# Patient Record
Sex: Female | Born: 1960 | Race: White | Hispanic: No | Marital: Married | Smoking: Never smoker
Health system: Southern US, Community
[De-identification: ages and names within clinical notes are randomized; demographics above are authoritative.]

## PROBLEM LIST (undated history)

## (undated) HISTORY — PX: KNEE ARTHROSCOPY WITH EXCISION BAKER'S CYST: SHX5646

---

## 1997-12-18 ENCOUNTER — Encounter: Payer: Self-pay | Admitting: *Deleted

## 1997-12-18 ENCOUNTER — Ambulatory Visit (HOSPITAL_COMMUNITY): Admission: RE | Admit: 1997-12-18 | Discharge: 1997-12-18 | Payer: Self-pay | Admitting: *Deleted

## 1998-03-03 ENCOUNTER — Encounter: Admission: RE | Admit: 1998-03-03 | Discharge: 1998-03-03 | Payer: Self-pay | Admitting: Family Medicine

## 1998-05-14 ENCOUNTER — Other Ambulatory Visit: Admission: RE | Admit: 1998-05-14 | Discharge: 1998-05-14 | Payer: Self-pay | Admitting: *Deleted

## 2000-11-14 ENCOUNTER — Other Ambulatory Visit: Admission: RE | Admit: 2000-11-14 | Discharge: 2000-11-14 | Payer: Self-pay | Admitting: *Deleted

## 2001-02-17 ENCOUNTER — Inpatient Hospital Stay (HOSPITAL_COMMUNITY): Admission: RE | Admit: 2001-02-17 | Discharge: 2001-02-17 | Payer: Self-pay | Admitting: Obstetrics and Gynecology

## 2001-03-17 ENCOUNTER — Inpatient Hospital Stay (HOSPITAL_COMMUNITY): Admission: RE | Admit: 2001-03-17 | Discharge: 2001-03-17 | Payer: Self-pay | Admitting: Obstetrics and Gynecology

## 2001-04-14 ENCOUNTER — Inpatient Hospital Stay (HOSPITAL_COMMUNITY): Admission: AD | Admit: 2001-04-14 | Discharge: 2001-04-14 | Payer: Self-pay | Admitting: *Deleted

## 2001-05-11 ENCOUNTER — Inpatient Hospital Stay (HOSPITAL_COMMUNITY): Admission: RE | Admit: 2001-05-11 | Discharge: 2001-05-11 | Payer: Self-pay | Admitting: Obstetrics and Gynecology

## 2001-05-30 ENCOUNTER — Ambulatory Visit (HOSPITAL_COMMUNITY): Admission: RE | Admit: 2001-05-30 | Discharge: 2001-06-05 | Payer: Self-pay | Admitting: *Deleted

## 2001-06-05 ENCOUNTER — Encounter: Payer: Self-pay | Admitting: *Deleted

## 2001-08-03 ENCOUNTER — Ambulatory Visit: Admission: RE | Admit: 2001-08-03 | Discharge: 2001-08-03 | Payer: Self-pay | Admitting: *Deleted

## 2001-08-16 ENCOUNTER — Other Ambulatory Visit: Admission: RE | Admit: 2001-08-16 | Discharge: 2001-08-16 | Payer: Self-pay | Admitting: *Deleted

## 2001-08-30 ENCOUNTER — Encounter (INDEPENDENT_AMBULATORY_CARE_PROVIDER_SITE_OTHER): Payer: Self-pay | Admitting: *Deleted

## 2001-08-30 ENCOUNTER — Encounter (INDEPENDENT_AMBULATORY_CARE_PROVIDER_SITE_OTHER): Payer: Self-pay | Admitting: Specialist

## 2001-08-30 ENCOUNTER — Ambulatory Visit (HOSPITAL_COMMUNITY): Admission: RE | Admit: 2001-08-30 | Discharge: 2001-08-30 | Payer: Self-pay | Admitting: *Deleted

## 2003-02-24 ENCOUNTER — Other Ambulatory Visit: Admission: RE | Admit: 2003-02-24 | Discharge: 2003-02-24 | Payer: Self-pay | Admitting: *Deleted

## 2006-05-24 ENCOUNTER — Encounter: Admission: RE | Admit: 2006-05-24 | Discharge: 2006-05-24 | Payer: Self-pay | Admitting: *Deleted

## 2010-03-31 ENCOUNTER — Ambulatory Visit
Admission: RE | Admit: 2010-03-31 | Discharge: 2010-03-31 | Payer: Self-pay | Source: Home / Self Care | Attending: Sports Medicine | Admitting: Sports Medicine

## 2010-03-31 DIAGNOSIS — M7750 Other enthesopathy of unspecified foot: Secondary | ICD-10-CM | POA: Insufficient documentation

## 2010-03-31 DIAGNOSIS — M79609 Pain in unspecified limb: Secondary | ICD-10-CM | POA: Insufficient documentation

## 2010-04-21 NOTE — Assessment & Plan Note (Signed)
Summary: NP - RUNNER   Vital Signs:  Patient profile:   50 year old female Height:      64 inches Weight:      118 pounds BMI:     20.33 Pulse rate:   80 / minute BP sitting:   146 / 93  (right arm)  Vitals Entered By: Rochele Pages, RN CC: L foot- great toe metatarsal pain x 3 weeks    CC:  L foot- great toe metatarsal pain x 3 weeks .  History of Present Illness: Pleasant lady who has started haveing left forefoot pain had just gotten to a long run of 6 miles now has cut back 2/2 pain normally runs about 15 mpw would like to train for half marathon runner for a few years no specific injury put into new shoes without change  Preventive Screening-Counseling & Management  Alcohol-Tobacco     Smoking Status: never  Social History: Smoking Status:  never  Physical Exam  General:  Well-developed,well-nourished,in no acute distress; alert,appropriate and cooperative throughout examination Msk:  normal leg lengths morotn's callus and splaing toes 2/3 on left morton's callus RT bilat trans arch collapse long arch mod on both feet pain on palpation of MT heads 2 and 3 left PF non tender RT and lt  great toes show some deviaiton at dip Extremities:  Running gait fairly neutral mid foot strike LT foot turns out a bit and that creates some pronation at forefoot   Impression & Recommendations:  Problem # 1:  FOOT PAIN, LEFT (ICD-729.5)  this is primarily form transverse arch breakdown starting to develop on RT as well  Orders: Foot Orthosis ( Arch Strap/Heel Cup) [phone])  Problem # 2:  METATARSALGIA (ICD-726.70)  this is structural but some worsening w gait change  note feels better with MT pad added to sports insoles added MT pads to both feet   will test these x 1 month if dong well with this can try to gradually increas  the training if not will reck  Orders: Foot Orthosis ( Arch Strap/Heel Cup) [phone])   Orders Added: 1)  New Patient Level II  [99202] 2)  Foot Orthosis ( Arch Strap/Heel Cup) [U9811]

## 2010-08-05 NOTE — Op Note (Signed)
[facility]  Patient:    Veronica Lam, Veronica Lam Visit Number: [id number] MRN: [id number]          Service Type: INF Location: [facility] Attending Physician:  Lenoard Aden Dictated by:   Lenoard Aden, M.D. Proc. Date: 03/17/01 Admit Date:  03/17/2001   CC:         [facility]   Operative Report  HISTORY:                      The patient is a 50 year old female, G2, P1, who presents with known female factor infertility for intrauterine donor intrauterine insemination.  PAST MEDICAL HISTORY:         Reviewed.  ALLERGIES:                    None.  MEDICATIONS:                  1. Benzamycin for topical acne.                               2. The patient currently taking prenatal                                  vitamins with folic acid.  DESCRIPTION OF PROCEDURE:     After appropriately thawing the specimen, and consents are signed, the specimen is inseminated with the use of a single-tooth tenaculum using a Cook intrauterine catheter without difficulty. Microscopic review of the specimen after procedure reveals greater than 10-15 motile sperm per high powered field.  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  PLAN:                         Follow up with missed menses.  Consider Clomid intrauterine insemination if unsuccessful after three months. Dictated by:   Lenoard Aden, M.D. Attending Physician:  Lenoard Aden DD:  03/17/01 TD:  03/17/01 Job: [id number]

## 2010-08-05 NOTE — Op Note (Signed)
[facility]  Patient:    Veronica Lam, Veronica Lam Visit Number: [id number] MRN: [id number]          Service Type: DSU Location: [facility] Attending Physician:  Ermalene Searing Dictated by:   Marina Gravel, M.D. Proc. Date: 08/30/01 Admit Date:  08/30/2001                             Operative Report  PREOPERATIVE DIAGNOSES:       Infertility, questionable endometrial polyp.  POSTOPERATIVE DIAGNOSES:      Infertility, questionable endometrial polyp.  PROCEDURE:                    Hysteroscopy and open diagnostic laparoscopy.  SURGEON:                      Marina Gravel, M.D.  ANESTHESIA:                   General.  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  Normal appearing tubes and ovaries and anterior cul-de-sac.  Filmy fluid loculation (clear in appearance) in the posterior cul-de-sac is sent with some peritoneal washings.  On hysteroscopy shaggy endometrium and questionable endometrial polyp are noted and is sent to pathology.  Tubal ostia were not well visualized due to the phase of the endometrium.  The appendix and upper abdomen appeared normal.  SPECIMENS:                    Fluid loculation from the pelvis and uterine contents.  ESTIMATED BLOOD LOSS:         Less than 50 cc.  FLUID DEFICIT:                Sorbitol 50 cc.  COMPLICATIONS:                None.  INDICATIONS:                  Patient with a history of unexplained infertility and questionable filling defect on HSG and sonohysterogram suggestive of endometrial polyp.  PROCEDURE:                    Patient was taken to the operating room and general anesthesia obtained.  She was placed in the ski position and examined under anesthesia with an anteverted normal sized uterus noted.  She was prepped and draped in the standard fashion and a red rubber catheter inserted into the bladder.  The speculum was inserted into the vagina and the anterior  lip of the cervix grasped with a tenaculum.  Due to the extreme anteversion of the uterus, the cervix needed to be dilated at the internal os to #17 to allow passage of the 5 mm diagnostic hysteroscope.  Sorbitol was used as the distention media and the endometrium was inspected with the above findings noted.  The area considered to be a possible endometrial polyp was removed with stone forceps and submitted to pathology.  The tubal ostia could not be well visualized due to the endometrial phase and the hysteroscopic portion of the procedure was terminated.  The Hulka tenaculum was then inserted into the uterine cavity, attached to the anterior lip of the cervix, and the other instruments removed from  the vagina.  Gowns and gloves were changed, attention turned to the abdomen.  The umbilicus was infiltrated with local anesthesia and the skin divided sharply with the knife in a vertical fashion just below the umbilicus.  Dissection was carried sharply to the underlying fascia.  The fascia was divided sharply with a knife and elevated with Kocher clamps.  The underlying posterior sheath and peritoneum were elevated and entered sharply.  A purse-string suture of 0 Vicryl was placed on the fascial defect.  The Hasson cannula was inserted and secured with a purse-string suture.  Intra-abdominal placement was confirmed with a laparoscope and pneumoperitoneum obtained with CO2 gas.  The patient was placed in Trendelenburg position.  A 5 mm port was placed 3 cm above the symphysis and 6 cm off the midline to the left under direct laparoscopic visualization.  The bowel was mobilized superiorly and the pelvis inspected with the above findings noted.  The clear fluid loculation was collected by placing an endo catch through the Hasson and a 5 mm scope through the side port.  This was submitted to pathology as a specimen and pelvic washings were also sent.  The pelvis was systematically inspected  and no other abnormalities were noted. The upper abdomen was also inspected and no abnormalities were seen. Therefore, this portion of procedure was also terminated.  The 5 mm port was removed under direct laparoscopic visualization and the site was hemostatic.  The scope was removed, gas released, and Hasson cannula were removed.  I inserted my index finger through the fascial defect and snugged down the purse-string suture.  This obliterated the fascial defect and prevented any herniation of intra-abdominal contents through the defect prior to closure.  The skin was closed at each site with subcuticular 4-0 Vicryl.  The patient tolerated procedure well and there were no complications.  She was taken to the recovery room awake, alert, and in stable condition. Counts were correct per the operating room staff. Dictated by:   Marina Gravel, M.D. Attending Physician:  Marina Gravel B DD:  08/30/01 TD:  09/01/01 Job: [id number]

## 2010-08-05 NOTE — H&P (Signed)
[facility]  Patient:    Veronica Lam, Veronica Lam Visit Number: [id number] MRN: [id number]          Service Type: Attending:  Marina Gravel, M.D. Dictated by:   Marina Gravel, M.D. Adm. Date:  08/30/01                           History and Physical  PREOPERATIVE DIAGNOSIS:       Infertility, questionable intrauterine filling defect.  HISTORY OF PRESENT ILLNESS:   A 50 year old white female gravida 0, history of infertility status post six cycles of donor insemination for female factor infertility.  HSG was performed along the way during her treatment course and this showed patent tubes and a questionable intrauterine filling defect. Sonohysterogram performed in the office showed a questionable small polyp; however, not definitively clear that this was actually present.  The patient presents for additional diagnostic evaluation of her infertility and questionable intrauterine filling defect.  PAST MEDICAL HISTORY:         None.  PAST OBSTETRICAL HISTORY:     Gravida 2 para 1 A 1, spontaneous vaginal delivery x1 and spontaneous abortion x1 with no D&C but questionable infection postoperatively.  PAST SURGICAL HISTORY:        Bakers cyst left knee.  MEDICATIONS:                  Prenatal vitamins.  ALLERGIES:                    None.  SOCIAL HISTORY:               No alcohol, tobacco, or other drugs.  FAMILY HISTORY:               Diabetes, coronary artery disease, lung cancer, and cervical cancer.  REVIEW OF SYSTEMS:            Otherwise noncontributory.  PHYSICAL EXAMINATION:  VITAL SIGNS:                  Blood pressure 104/60, pulse 80, respiratory rate 16.  GENERAL:                      Alert and oriented, no acute distress.  SKIN:                         Warm and dry, no lesions.  HEART:                        Regular rate and rhythm.  LUNGS:                        Clear to auscultation.  ABDOMEN:                      Soft.  Liver and  spleen normal, no hernia.  PELVIC:                       Normal external genitalia, vagina and cervix normal.  ASSESSMENT:                   Infertility, questionable intrauterine filling defect.  PLAN:                         Diagnostic hysteroscopy  and diagnostic laparoscopy with potential treatment of endometriosis with electrocautery or laser if identified.  Also, potential removal of endometrial polyp if identified.  Operative risks discussed including infection, bleeding, uterine perforation, damage to bowel, bladder, or surrounding organs.  All questions answered and the patient wishes to proceed. Dictated by:   Marina Gravel, M.D. Attending:  Marina Gravel, M.D. DD:  08/27/01 TD:  08/27/01 Job: [id number]

## 2011-09-13 ENCOUNTER — Ambulatory Visit: Payer: 59

## 2011-09-13 ENCOUNTER — Ambulatory Visit (INDEPENDENT_AMBULATORY_CARE_PROVIDER_SITE_OTHER): Payer: 59 | Admitting: Emergency Medicine

## 2011-09-13 VITALS — BP 130/87 | HR 89 | Temp 98.6°F | Resp 16 | Ht 64.0 in | Wt 115.0 lb

## 2011-09-13 DIAGNOSIS — M79609 Pain in unspecified limb: Secondary | ICD-10-CM

## 2011-09-13 DIAGNOSIS — S6390XA Sprain of unspecified part of unspecified wrist and hand, initial encounter: Secondary | ICD-10-CM

## 2011-09-13 DIAGNOSIS — M79643 Pain in unspecified hand: Secondary | ICD-10-CM

## 2011-09-13 DIAGNOSIS — S63619A Unspecified sprain of unspecified finger, initial encounter: Secondary | ICD-10-CM

## 2011-09-13 DIAGNOSIS — IMO0002 Reserved for concepts with insufficient information to code with codable children: Secondary | ICD-10-CM

## 2011-09-13 DIAGNOSIS — T07XXXA Unspecified multiple injuries, initial encounter: Secondary | ICD-10-CM

## 2011-09-13 DIAGNOSIS — Z23 Encounter for immunization: Secondary | ICD-10-CM

## 2011-09-13 MED ORDER — HYDROCODONE-ACETAMINOPHEN 5-500 MG PO TABS: 1.0000 | ORAL_TABLET | ORAL | Status: AC | PRN

## 2011-09-13 NOTE — Progress Notes (Signed)
     Patient Name: Veronica Lam Date of Birth: 1960-04-26 Medical Record Number: [id number] Gender: female Date of Encounter: 09/13/2011  History of Present Illness:  Veronica Lam is a 51 y.o. very pleasant female patient who presents with the following:  Patient was jogging and tripped and fell landing on outstretched right hand and has pain in right small finger.  She has a large abrasion on her right shoulder that has full ROM and an abrasion on her right malar eminence with no complaint of neuro or visual symptoms nor LOC.    Patient Active Problem List  Diagnosis  . METATARSALGIA  . FOOT PAIN, LEFT   No past medical history on file. No past surgical history on file. History  Substance Use Topics  . Smoking status: Never Smoker   . Smokeless tobacco: Not on file  . Alcohol Use: Yes     SOCIAL   No family history on file. No Known Allergies  Medication list has been reviewed and updated.  Prior to Admission medications   Not on File    Review of Systems:  As per HPI, otherwise negative.    Physical Examination: Filed Vitals:   09/13/11 0950  BP: 130/87  Pulse: 89  Temp: 98.6 F (37 C)  Resp: 16   Filed Vitals:   09/13/11 0950  Height: 5\' 4"  (1.626 m)  Weight: 115 lb (52.164 kg)   Body mass index is 19.74 kg/(m^2). Ideal Body Weight: Weight in (lb) to have BMI = 25: 145.3    GEN: WDWN, NAD, Non-toxic, Alert & Oriented x 3 HEENT:  Normocephalic. TM intact PRRERLA EOMI abrasion right malar eminence.  Stable  Ears and Nose: No external deformity. EXTR: No clubbing/cyanosis/edema.  Abrasion right anterior shoulder.  Full ROM.  Pain and tenderness fifth finger. NEURO: Normal gait, balance and coordination.  PSYCH: Normally interactive. Conversant. Not depressed or anxious appearing.  Calm demeanor.    Assessment and Plan: Multiple abrasions  Finger sprain right fifth finger.   Splint  Follow up in one week  vicodin 5 mg  Carmelina Dane,  MD

## 2011-09-13 NOTE — Patient Instructions (Signed)
Abrasions Abrasions are skin scrapes. Their treatment depends on how large and deep the abrasion is. Abrasions do not extend through all layers of the skin. A cut or lesion through all skin layers is called a laceration. HOME CARE INSTRUCTIONS   If you were given a dressing, change it at least once a day or as instructed by your caregiver. If the bandage sticks, soak it off with a solution of water or hydrogen peroxide.   Twice a day, wash the area with soap and water to remove all the cream/ointment. You may do this in a sink, under a tub faucet, or in a shower. Rinse off the soap and pat dry with a clean towel. Look for signs of infection (see below).   Reapply cream/ointment according to your caregiver's instruction. This will help prevent infection and keep the bandage from sticking. Telfa or gauze over the wound and under the dressing or wrap will also help keep the bandage from sticking.   If the bandage becomes wet, dirty, or develops a foul smell, change it as soon as possible.   Only take over-the-counter or prescription medicines for pain, discomfort, or fever as directed by your caregiver.  SEEK IMMEDIATE MEDICAL CARE IF:   Increasing pain in the wound.   Signs of infection develop: redness, swelling, surrounding area is tender to touch, or pus coming from the wound.   You have a fever.   Any foul smell coming from the wound or dressing.  Most skin wounds heal within ten days. Facial wounds heal faster. However, an infection may occur despite proper treatment. You should have the wound checked for signs of infection within 24 to 48 hours or sooner if problems arise. If you were not given a wound-check appointment, look closely at the wound yourself on the second day for early signs of infection listed above. MAKE SURE YOU:   Understand these instructions.   Will watch your condition.   Will get help right away if you are not doing well or get worse.  Document Released:  12/14/2004 Document Revised: 02/23/2011 Document Reviewed: 02/07/2011 [facility] Patient Information 2012 [place], [place].Finger Sprain A finger sprain is a tear in one of the strong, fibrous tissues that connect the bones (ligaments) in your finger. The severity of the sprain depends on how much of the ligament is torn. The tear can be either partial or complete. CAUSES  Often, sprains are a result of a fall or accident. If you extend your hands to catch an object or to protect yourself, the force of the impact causes the fibers of your ligament to stretch too much. This excess tension causes the fibers of your ligament to tear. SYMPTOMS  You may have some loss of motion in your finger. Other symptoms include:  Bruising.   Tenderness.   Swelling.  DIAGNOSIS  In order to diagnose finger sprain, your caregiver will physically examine your finger or thumb to determine how torn the ligament is. Your caregiver may also suggest an X-ray exam of your finger to make sure no bones are broken. TREATMENT  If your ligament is only partially torn, treatment usually involves keeping the finger in a fixed position (immobilization) for a short period. To do this, your caregiver will apply a bandage, cast, or splint to keep your finger from moving until it heals. For a partially torn ligament, the healing process usually takes 2 to 3 weeks. If your ligament is completely torn, you may need surgery to reconnect the ligament  to the bone. After surgery a cast or splint will be applied and will need to stay on your finger or thumb for 4 to 6 weeks while your ligament heals. HOME CARE INSTRUCTIONS  Keep your injured finger elevated, when possible, to decrease swelling.   To ease pain and swelling, apply ice to your joint twice a day, for 2 to 3 days:   Put ice in a plastic bag.   Place a towel between your skin and the bag.   Leave the ice on for 15 minutes.   Only take over-the-counter or prescription  medicine for pain as directed by your caregiver.   Do not wear rings on your injured finger.   Do not leave your finger unprotected until pain and stiffness go away (usually 3 to 4 weeks).   Do not allow your cast or splint to get wet. Cover your cast or splint with a plastic bag when you shower or bathe. Do not swim.   Your caregiver may suggest special exercises for you to do during your recovery to prevent or limit permanent stiffness.  SEEK IMMEDIATE MEDICAL CARE IF:  Your cast or splint becomes damaged.   Your pain becomes worse rather than better.  MAKE SURE YOU:  Understand these instructions.   Will watch your condition.   Will get help right away if you are not doing well or get worse.  Document Released: 04/13/2004 Document Revised: 02/23/2011 Document Reviewed: 11/07/2010 [facility] Patient Information 2012 [place], [place].

## 2011-09-13 NOTE — Progress Notes (Signed)
  Subjective:    Patient ID: Veronica Lam, female    DOB: 07-Aug-1960, 51 y.o.   MRN: [id number]  HPI    Review of Systems     Objective:   Physical Exam        Assessment & Plan:   [facility] reading (PRIMARY) by  Dr. Dareen Piano  No osseous injury.

## 2012-03-06 ENCOUNTER — Ambulatory Visit: Payer: 59

## 2012-03-06 ENCOUNTER — Ambulatory Visit (INDEPENDENT_AMBULATORY_CARE_PROVIDER_SITE_OTHER): Payer: 59 | Admitting: Family Medicine

## 2012-03-06 VITALS — BP 134/85 | HR 91 | Temp 98.1°F | Resp 16 | Ht 65.0 in | Wt 122.0 lb

## 2012-03-06 DIAGNOSIS — M65839 Other synovitis and tenosynovitis, unspecified forearm: Secondary | ICD-10-CM

## 2012-03-06 DIAGNOSIS — M778 Other enthesopathies, not elsewhere classified: Secondary | ICD-10-CM

## 2012-03-06 DIAGNOSIS — M65849 Other synovitis and tenosynovitis, unspecified hand: Secondary | ICD-10-CM

## 2012-03-06 DIAGNOSIS — M79643 Pain in unspecified hand: Secondary | ICD-10-CM

## 2012-03-06 DIAGNOSIS — M79609 Pain in unspecified limb: Secondary | ICD-10-CM

## 2012-03-06 DIAGNOSIS — M25539 Pain in unspecified wrist: Secondary | ICD-10-CM

## 2012-03-06 DIAGNOSIS — M23359 Other meniscus derangements, posterior horn of lateral meniscus, unspecified knee: Secondary | ICD-10-CM

## 2012-03-06 MED ORDER — MELOXICAM 15 MG PO TABS: 15.0000 mg | ORAL_TABLET | Freq: Every day | ORAL | Status: DC

## 2012-03-06 NOTE — Progress Notes (Signed)
[address]   [place], [place]  [address]   [phone]  Subjective:    Patient ID: Veronica Lam, female    DOB: Dec 24, 1960, 51 y.o.   MRN: [id number]  HPIThis 51 y.o. female presents for evaluation of persistent wrist pain.  Onset after fall in 08/2011; sprained 4th and 5th fingers; +stiffness in hand; decreased flexion of digits still.  No pain in wrist radial aspect with fall.  Onset of wrist pain one month after fall.  No swelling; no cysts or bony abnormality.  Pain with use of R wrist; pulling covers up.  Still hurts at night; nighttime is the worst time for pain.  No n/t.    Onset at night; intermittent pain.  Now having daytime pain.  If goes on long run, hand is getting numb.  No icing or NSAIDs.  No previous injury.  Homemaker.  R handed.  No weakness in hand.  Still has stiffness of 5th and 4th fingers from fall injury in 08/2011.  4th finger with decreased ROM; will cause pain with forced flexion.    PCP:  None; GYN: Fogleman   Review of Systems  Constitutional: Negative for fever.  Musculoskeletal: Positive for myalgias and arthralgias. Negative for joint swelling.  Neurological: Negative for weakness and numbness.    History reviewed. No pertinent past medical history.  Past Surgical History  Procedure Date  . Knee arthroscopy with excision baker's cyst     Prior to Admission medications   Medication Sig Start Date End Date Taking? Authorizing Provider  doxycycline (DORYX) 75 MG EC tablet Take 75 mg by mouth 2 (two) times daily.   Yes Historical Provider, MD    No Known Allergies  History   Social History  . Marital Status: Married    Spouse Name: N/A    Number of Children: N/A  . Years of Education: N/A   Occupational History  . Not on file.   Social History Main Topics  . Smoking status: Never Smoker   . Smokeless tobacco: Not on file  . Alcohol Use: Yes     Comment: SOCIAL  . Drug Use: Not on file  . Sexually Active: Not on file   Other Topics  Concern  . Not on file   Social History Narrative  . No narrative on file    Family History  Problem Relation Age of Onset  . Hypertension Mother   . Stroke Maternal Grandfather   . Stroke Paternal Grandmother   . Heart attack Paternal Grandfather        Objective:   Physical Exam  Nursing note and vitals reviewed. Constitutional: She is oriented to person, place, and time. She appears well-developed and well-nourished. No distress.  Musculoskeletal:       Right wrist: She exhibits normal range of motion, no tenderness, no bony tenderness and no swelling.       Left hand: She exhibits decreased range of motion. She exhibits no tenderness, no bony tenderness and no deformity. Decreased strength noted.       R WRIST:  FULL FLEXION/EXTENSION/SUPINATION/PRONATION.  +FINKELSTEIN'S.  GRIP 5/5. TINEL AND PHALENS NEGATIVE. R HAND:  DECREASED FLEXION PIP 4TH, 5TH R DIGITS.  NO SWELLING; NON-TENDER TO PALPATION.  Neurological: She is alert and oriented to person, place, and time. No sensory deficit.  Skin: She is not diaphoretic.    [facility] reading (PRIMARY) by  Dr. Katrinka Blazing.  R WRIST:  Negative.   R ARM:  negative  Assessment & Plan:   1. Hand pain  DG Hand Complete Right, DG Wrist Complete Right  2. Wrist pain  DG Hand Complete Right, DG Wrist Complete Right  3.  Tendonitis, R wrist   1.  R wrist Tendonitis:  New.  Rx for Mobic 15mg  daily.  Ice wrist qhs.  Thumb spica with activity for next week; wear qhs for next month.  If no improvement in one month, to call for ortho referral and PT referral. 2.  Stiffness, R hand 4th and 5th fingers:  Persistent; recommend squeezing soft ball daily for next month; if no improvement in one month, to call for ortho and PT referral.    Meds ordered this encounter  Medications  . doxycycline (DORYX) 75 MG EC tablet    Sig: Take 75 mg by mouth 2 (two) times daily.  . meloxicam (MOBIC) 15 MG tablet    Sig: Take 1 tablet (15 mg total) by mouth  daily.    Dispense:  30 tablet    Refill:  1

## 2012-03-06 NOTE — Patient Instructions (Addendum)
1. Hand pain  DG Hand Complete Right, DG Wrist Complete Right, meloxicam (MOBIC) 15 MG tablet  2. Wrist pain  DG Hand Complete Right, DG Wrist Complete Right, meloxicam (MOBIC) 15 MG tablet  3. Tendonitis of wrist, right     De Quervain's Disease Suzette Battiest disease is a condition often seen in racquet sports where there is a soreness (inflammation) in the cord like structures (tendons) which attach muscle to bone on the thumb side of the wrist. There may be a tightening of the tissuesaround the tendons. This condition is often helped by giving up or modifying the activity which caused it. When conservative treatment does not help, surgery may be required. Conservative treatment could include changes in the activity which brought about the problem or made it worse. Anti-inflammatory medications and injections may be used to help decrease the inflammation and help with pain control. Your caregiver will help you determine which is best for you. DIAGNOSIS  Often the diagnosis (learning what is wrong) can be made by examination. Sometimes x-rays are required. HOME CARE INSTRUCTIONS   Apply ice to the sore area for 15 to 20 minutes, 3 to 4 times per day while awake. Put the ice in a plastic bag and place a towel between the bag of ice and your skin. This is especially helpful if it can be done after all activities involving the sore wrist.  Temporary splinting may help.  Only take over-the-counter or prescription medicines for pain, discomfort or fever as directed by your caregiver. SEEK MEDICAL CARE IF:   Pain relief is not obtained with medications, or if you have increasing pain and seem to be getting worse rather than better. MAKE SURE YOU:   Understand these instructions.  Will watch your condition.  Will get help right away if you are not doing well or get worse. Document Released: 11/29/2000 Document Revised: 05/29/2011 Document Reviewed: 03/06/2005 [facility] Patient Information 2013  [place], [place].

## 2012-03-15 NOTE — Progress Notes (Signed)
Reviewed and agree.

## 2012-04-15 ENCOUNTER — Telehealth: Payer: Self-pay

## 2012-04-15 DIAGNOSIS — M778 Other enthesopathies, not elsewhere classified: Secondary | ICD-10-CM

## 2012-04-15 NOTE — Telephone Encounter (Signed)
Patient advised on this, she has not seen an orthopedist before, advised her she will get call back about this.

## 2012-04-15 NOTE — Telephone Encounter (Signed)
Recommend referral to orthopedics for further evaluation and likely physical therapy.  We will schedule pt for ortho appointment.  I have placed order for referral.

## 2012-04-15 NOTE — Telephone Encounter (Signed)
Pt states she saw dr Katrinka Blazing around [facility] for tendonitis and is not improving. Asks if she needs to recheck or have a referral?  Best: [phone]  bf

## 2012-08-06 ENCOUNTER — Ambulatory Visit (INDEPENDENT_AMBULATORY_CARE_PROVIDER_SITE_OTHER): Payer: 59 | Admitting: Sports Medicine

## 2012-08-06 ENCOUNTER — Encounter: Payer: Self-pay | Admitting: Sports Medicine

## 2012-08-06 VITALS — BP 162/83 | HR 76 | Ht 65.0 in | Wt 122.0 lb

## 2012-08-06 DIAGNOSIS — M25561 Pain in right knee: Secondary | ICD-10-CM

## 2012-08-06 DIAGNOSIS — M25569 Pain in unspecified knee: Secondary | ICD-10-CM | POA: Insufficient documentation

## 2012-08-06 NOTE — Patient Instructions (Addendum)
Try compression sleeve over calf (Size Small) for at least 6 weeks to help muscle heal.  Do the following exercises for strengthening: 1. Hold 10 lb dumbbell while walking on heals across room 3 sets of 10.  2. Pull up against a surface with the top of your foot. 10 times for 10 seconds. 3. Find a step on your tip toes and drop off heals, hold for 10 seconds. 1. Both feet together, 2. Each foot separately. 4. Perform toe walks with 5-10 lb weight.  Run on the treadmill for a softer even surface. If painful, then go back down to an elliptical machine.   Consider changing your running form to avoid excess forward lean. Goal to make your trunk more upright while running.

## 2012-08-06 NOTE — Assessment & Plan Note (Signed)
I recommended a calf compression sleeve  Series of exercises to work the anterior tibialis muscle  Running on a treadmill or elliptical until 30 minutes is relatively pain free  Then she can resume running outside  Recheck if not resolved in 6 weeks

## 2012-08-06 NOTE — Progress Notes (Signed)
Patient ID: Veronica Lam, female   DOB: March 05, 1961, 52 y.o.   MRN: [id number]  2 weeks prior to tar heel 10 mile ran half marathon During half left foot painful Took 2 weeks off  3 weeks ago during 10 mile big downhill and running hard Pain on outside of upper tibia just below RT knee Walked and ran to finish but pretty painful  No swelling noted Hurt with rolling over in bed Took another week off and still hurting Walking and biking OK  Pexam  No acute distress  Knee: Normal to inspection with no erythema or effusion or obvious bony abnormalities. Palpation normal with no warmth or joint line tenderness or patellar tenderness or condyle tenderness. ROM normal in flexion and extension and lower leg rotation. Ligaments with solid consistent endpoints including ACL, PCL, LCL, MCL. Negative Mcmurray's and provocative meniscal tests. Non painful patellar compression. Patellar and quadriceps tendons unremarkable. Hamstring and quadriceps strength is normal.  Tenderness at the upper portion of the anterior tibialis insertion  Hop test negative  Running gait shows too much forward trunk lean  Ultrasound Scanning around the knee is unremarkable  Scanning of the upper tibia shows no abnormality of the cortex There is some increased Doppler flow in the anterior tibialis muscle At the junction of two muscle bundles of the anterior tibialis there is an area of what appears to be muscle fiber disruption with mild increase in hypoechoic change

## 2012-08-19 ENCOUNTER — Ambulatory Visit: Payer: Self-pay | Admitting: Sports Medicine

## 2012-08-29 ENCOUNTER — Other Ambulatory Visit: Payer: Self-pay | Admitting: Dermatology

## 2013-01-06 ENCOUNTER — Other Ambulatory Visit: Payer: Self-pay

## 2013-01-06 DIAGNOSIS — Z1231 Encounter for screening mammogram for malignant neoplasm of breast: Secondary | ICD-10-CM

## 2013-01-22 ENCOUNTER — Ambulatory Visit
Admission: RE | Admit: 2013-01-22 | Discharge: 2013-01-22 | Disposition: A | Discharge status: Home / Self Care | Payer: 59 | Source: Ambulatory Visit

## 2013-01-22 DIAGNOSIS — Z1231 Encounter for screening mammogram for malignant neoplasm of breast: Secondary | ICD-10-CM

## 2013-09-08 ENCOUNTER — Ambulatory Visit (INDEPENDENT_AMBULATORY_CARE_PROVIDER_SITE_OTHER): Payer: Managed Care, Other (non HMO) | Admitting: Internal Medicine

## 2013-09-08 ENCOUNTER — Encounter: Payer: Self-pay | Admitting: Internal Medicine

## 2013-09-08 VITALS — BP 112/70 | HR 58 | Temp 98.1°F | Ht 64.0 in | Wt 105.0 lb

## 2013-09-08 DIAGNOSIS — R7309 Other abnormal glucose: Secondary | ICD-10-CM | POA: Insufficient documentation

## 2013-09-08 DIAGNOSIS — E0590 Thyrotoxicosis, unspecified without thyrotoxic crisis or storm: Secondary | ICD-10-CM

## 2013-09-08 LAB — TSH: TSH: 6.41 u[IU]/mL — ABNORMAL HIGH (ref 0.35–4.50)

## 2013-09-08 LAB — T3, FREE: T3 FREE: 2.2 pg/mL — AB (ref 2.3–4.2)

## 2013-09-08 LAB — HEMOGLOBIN A1C: Hgb A1c MFr Bld: 5.9 % (ref 4.6–6.5)

## 2013-09-08 LAB — T4, FREE: Free T4: 0.44 ng/dL — ABNORMAL LOW (ref 0.60–1.60)

## 2013-09-08 NOTE — Progress Notes (Signed)
Patient ID: Veronica DoomLori A Haskins, female   DOB: 03/22/1960, 53 y.o.   MRN: [id number]   HPI  Veronica Lam is a 53 y.o.-year-old female, referred by Dr. Ernestina PennaFogleman (ObGyn), in consultation for subclinical hyperthyroidism, fatigue, and also for high HbA1c (? Diabetes/prediabetes).  Wilkes hyperthyroidism She describes getting sick at beginning of 07/2013 after she ran a half-marathon  - sore throat, hoarseness,  Exhaustion, fever, dizziness - and still has fatigue,  nausea and lightheadedness. No neck compression sxs. She also mentions that she had hoarseness, now resolved.   I reviewed pt's thyroid tests: 08/19/2013: TSH 0.01, Free T4 1.3, TT3 115 (76-181)   Pt denies feeling nodules in neck, has hoarseness, dysphagia/odynophagia, SOB with lying down; she c/o: - + several occasions with: she excessive sweating/heat intolerance - only at night - no tremors - no anxiety, but irritable. She is also moving, so she is more stressed lately - had palpitations and jitteriness 1 mo ago, now resolved - gets dizzy and weak when stands up - no problems with concentration - no hyperdefecation - + weight loss at the beginning of 07/2013 >> 5 lbs (did not regain them)  Pt does not have a FH of thyroid ds. No FH of thyroid cancer. No h/o radiation tx to head or neck.  No seaweed or kelp, no recent contrast studies. No steroid use. + herbal supplements: probiotic.  She had a Mirena IUD inserted in 06/2013 for heavy menses >> now better.   Elevated HbA1c: 08/19/2013: HbA1c 6.2% - sister has GDM and DM2 - runs 4x a week, trains for half-marathons - tries to eat healthy: - Breakfast: oatmeal w/ apple - Lunch: leftovers from dinner - Dinner: vegetarian: beans/soy, grain or starch, veggies - Snacks: not usually   ROS: Constitutional: see HPI, + poor sleep, + nocturia  Eyes: no blurry vision, no xerophthalmia ENT: + had sore throat, no nodules palpated in throat, no dysphagia/odynophagia, +  hoarseness Cardiovascular: no CP/SOB/+ had palpitations/leg swelling Respiratory: no cough/SOB Gastrointestinal: + N/no V/D/C Musculoskeletal: no muscle/joint aches Skin: no rashes Neurological: no tremors/numbness/tingling/+ dizziness Psychiatric: no depression/anxiety  Past Surgical History  Procedure Laterality Date  . Knee arthroscopy with excision baker's cyst     History   Social History  . Marital Status: Married    Spouse Name: N/A    Number of Children: 1   Occupational History  . homemaker   Social History Main Topics  . Smoking status: Never Smoker   . Smokeless tobacco: Not on file  . Alcohol Use: Yes     Comment: SOCIAL - 1-2x a wk  . Drug Use: No   Current Outpatient Prescriptions on File Prior to Visit  Medication Sig Dispense Refill  . doxycycline (DORYX) 75 MG EC tablet Take 75 mg by mouth 2 (two) times daily.       No current facility-administered medications on file prior to visit.   No Known Allergies Family History  Problem Relation Age of Onset  . Hypertension Mother   . Stroke Maternal Grandfather   . Stroke Paternal Grandmother   . Heart attack Paternal Grandfather    PE: BP 112/70  Pulse 58  Temp(Src) 98.1 F (36.7 C) (Oral)  Ht 5\' 4"  (1.626 m)  Wt 105 lb (47.628 kg)  BMI 18.01 kg/m2  SpO2 98% Wt Readings from Last 3 Encounters:  09/08/13 105 lb (47.628 kg)  08/06/12 122 lb (55.339 kg)  03/06/12 122 lb (55.339 kg)   Constitutional: thin, in NAD Eyes:  PERRLA, EOMI, no exophthalmos, no lid lag, no stare ENT: moist mucous membranes, no thyromegaly, no thyroid bruits, no cervical lymphadenopathy Cardiovascular: RRR, No MRG Respiratory: CTA B Gastrointestinal: abdomen soft, NT, ND, BS+ Musculoskeletal: no deformities, strength intact in all 4 Skin: moist, warm, no rashes Neurological: no tremor with outstretched hands, DTR normal in all 4  ASSESSMENT: 1. Subclinical hyperthyroidism  2. Fatigue - also dizziness, nausea  3.  Elevated HbA1c  PLAN:  1. Patient with a recently found low TSH, with thyrotoxic sxs (which are improving): weight loss, heat intolerance, palpitations, irritability. Her nausea and dizziness are not common sxs of hyperthyroidism...  - she does not appear to have exogenous causes for the low TSH.  - We discussed that possible causes of thyrotoxicosis are:  Thyroiditis - most likely based on h/o URI with flu-like sxs Graves ds   toxic multinodular goiter/ toxic adenoma (I cannot feel nodules at palpation of his thyroid). - I suggested that we check the TSH, fT3 and fT4 and also had thyroid stimulating antibodies to screen for Graves' disease.  - If the tests remain abnormal we may an uptake and scan to differentiate between the 3 above possible etiologies  - we discussed about possible modalities of treatment for the above conditions, to include methimazole use or radioactive iodine ablation. - I do not feel that we need to add beta blockers at this time, since she is not tachycardic, anxious, or tremulous - I advised her to join my chart to communicate easier - RTC in 2 months, but likely sooner for repeat labs  2. Fatigue - her nausea, dizziness and generalized fatigue appear to have started after the insertion of her Mirena IUD - we discussed that these are possible sxs of increased Progesterone, although the absorption is not very high  - if these do not get better over the last month, we may need to check a cortisol level - I also advised her to stay hydrated since these can be signs of dehydration especially as she runs outside  - she was recently started on vitamin D for vit D def  3. Elevated HbA1c - this is in the range for prediabetes - will first recheck the HbA1c today - she is exercising intensely and eats healthy. She has FH of DM, so she is higher risk for the disease.  Office Visit on 09/08/2013  Component Date Value Ref Range Status  . TSH 09/08/2013 6.41* 0.35 - 4.50  uIU/mL Final  . Free T4 09/08/2013 0.44* 0.60 - 1.60 ng/dL Final  . T3, Free 16/10/960406/22/2015 2.2* 2.3 - 4.2 pg/mL Final  . Hemoglobin A1C 09/08/2013 5.9  4.6 - 6.5 % Final   Glycemic Control Guidelines for People with Diabetes:Non Diabetic:  <6%Goal of Therapy: <7%Additional Action Suggested:  >8%    The TFTs now indicate hypothyroidism >> this is typical for resolving thyroiditis. Will recheck TFTs in 1 month as planned to see if she needs LT4 replacement.  HbA1c is 5.9, close to the lower threshold for prediabetes.

## 2013-09-08 NOTE — Patient Instructions (Signed)
Please stop at the lab. Please also come for labs in 1 month. Please return for another visit in 2 months.

## 2013-09-12 LAB — THYROID STIMULATING IMMUNOGLOBULIN: TSI: 33 %{baseline} (ref <140)

## 2013-10-16 ENCOUNTER — Other Ambulatory Visit (INDEPENDENT_AMBULATORY_CARE_PROVIDER_SITE_OTHER): Payer: Managed Care, Other (non HMO)

## 2013-10-16 DIAGNOSIS — E0590 Thyrotoxicosis, unspecified without thyrotoxic crisis or storm: Secondary | ICD-10-CM

## 2013-10-16 LAB — T3, FREE: T3 FREE: 2.2 pg/mL — AB (ref 2.3–4.2)

## 2013-10-16 LAB — TSH: TSH: 2.36 u[IU]/mL (ref 0.35–4.50)

## 2013-10-16 LAB — T4, FREE: FREE T4: 0.58 ng/dL — AB (ref 0.60–1.60)

## 2013-11-11 ENCOUNTER — Ambulatory Visit (INDEPENDENT_AMBULATORY_CARE_PROVIDER_SITE_OTHER): Payer: Managed Care, Other (non HMO) | Admitting: Internal Medicine

## 2013-11-11 ENCOUNTER — Encounter: Payer: Self-pay | Admitting: Internal Medicine

## 2013-11-11 VITALS — BP 102/62 | HR 60 | Temp 97.6°F | Resp 12 | Wt 104.0 lb

## 2013-11-11 DIAGNOSIS — R7309 Other abnormal glucose: Secondary | ICD-10-CM

## 2013-11-11 DIAGNOSIS — E061 Subacute thyroiditis: Secondary | ICD-10-CM

## 2013-11-11 LAB — TSH: TSH: 1.01 u[IU]/mL (ref 0.35–4.50)

## 2013-11-11 LAB — T4, FREE: Free T4: 0.69 ng/dL (ref 0.60–1.60)

## 2013-11-11 LAB — T3, FREE: T3, Free: 2.4 pg/mL (ref 2.3–4.2)

## 2013-11-11 NOTE — Patient Instructions (Signed)
Please stop at the lab. We may need a repeat set of labs (along with a HbA1c) in 3 months.

## 2013-11-11 NOTE — Progress Notes (Signed)
Patient ID: Veronica Lam, female   DOB: 11-Sep-1960, 53 y.o.   MRN: [id number]   HPI  Veronica Lam is a 53 y.o.-year-old female, initially referred by Dr. Ernestina Penna ([facility]), returning for f/u for subacute thyroiditis and also for high HbA1c. Last visit 2 mo ago.  Veronica Lam hyperthyroidism Reviewed hx: She described getting sick at beginning of 07/2013 after she ran a half-marathon  - sore throat, hoarseness,  Exhaustion, fever, dizziness - and still has fatigue,  nausea and lightheadedness. No neck compression sxs. She also mentions that she had hoarseness.   She saw her ObGyn Dr >> TFTs checked >> TSH low, free t4 normal (see below) >> referred to endocrinology. 08/19/2013: TSH 0.01, Free T4 1.3, TT3 115 (76-181)   At last visit, we repeated the thyroid tests and TSI antibodies: c/w hypothyroidism. Thyroiditis was suspected and pt was not started on meds >> TFTs repeated in 1 mo >> improved. The TSIs were normal (see below): Component     Latest Ref Rng 09/08/2013 10/16/2013  TSH     0.35 - 4.50 uIU/mL 6.41 (H) 2.36  Free T4     0.60 - 1.60 ng/dL 4.09 (L) 8.11 (L)  T3, Free     2.3 - 4.2 pg/mL 2.2 (L) 2.2 (L)  TSI     <140 % baseline 33    Pt denies feeling nodules in neck, has hoarseness, dysphagia/odynophagia, SOB with lying down.  She feels back to baseline and denies any of the below sxs: - no excessive sweating/heat intolerance - no tremors - no anxiety, but irritable. She is also moving, so she is more stressed lately - had palpitations and jitteriness- now resolved - resolved dizziness  - no problems with concentration - no hyperdefecation - stable weight  She restarted running - feels great.  Elevated HbA1c: 09/08/2013: HbA1c 5.9% - repeated at our last visit 08/19/2013: HbA1c 6.2%  - sister has GDM and DM2 - runs 4x a week - resumed this in the last month  I reviewed pt's medications, allergies, PMH, social hx, family hx and no changes required, except as mentioned  above.  ROS: Constitutional: see HPI, no poor sleep Eyes: no blurry vision, no xerophthalmia ENT: no sore throat, no nodules palpated in throat, no dysphagia/odynophagia,no hoarseness Cardiovascular: no CP/SOB/palpitations/leg swelling Respiratory: no cough/SOB Gastrointestinal: no N/V/D/C Musculoskeletal: no muscle/joint aches Skin: no rashes Neurological: no tremors/numbness/tingling/dizziness  PE: BP 102/62  Pulse 60  Temp(Src) 97.6 F (36.4 C) (Oral)  Resp 12  Wt 104 lb (47.174 kg)  SpO2 98% Wt Readings from Last 3 Encounters:  11/11/13 104 lb (47.174 kg)  09/08/13 105 lb (47.628 kg)  08/06/12 122 lb (55.339 kg)   Constitutional: thin, in NAD Eyes: PERRLA, EOMI, no exophthalmos, no lid lag, no stare ENT: moist mucous membranes, no thyromegaly, no thyroid bruits, no cervical lymphadenopathy Cardiovascular: RRR, No MRG Respiratory: CTA B Gastrointestinal: abdomen soft, NT, ND, BS+ Musculoskeletal: no deformities, strength intact in all 4 Skin: moist, warm, no rashes Neurological: no tremor with outstretched hands, DTR normal in all 4  ASSESSMENT: 1. Subclinical hyperthyroidism - likely resolving subacute thyroiditis  2. Elevated HbA1c  PLAN:  1. Patient initially with low TSH, with thyrotoxic sxs (now resolved)  Followed by a high TSH (hypothyroidism) >> this is typical for resolving thyroiditis. Will recheck TFTs today to see if she needs LT4 replacement. - I will send her her lab results through mychart - if labs normal >> would like a repeat TFT panel  in 3 months, but will not schedule a new appt. She agrees with the plan.  2. Elevated HbA1c - The high HbA1c could have been in the context of thyroiditis, but the latest check 2 mo ago showed only a slightly elevated HbA1c - will will recheck the HbA1c in 3 mo - she is exercising intensely and eats healthy. She has FH of DM, so she is higher risk for the disease.  Office Visit on 11/11/2013  Component Date  Value Ref Range Status  . TSH 11/11/2013 1.01  0.35 - 4.50 uIU/mL Final  . Free T4 11/11/2013 0.69  0.60 - 1.60 ng/dL Final  . T3, Free 16/12/9602 2.4  2.3 - 4.2 pg/mL Final   Labs normalized >> see plan above.

## 2014-01-11 IMAGING — CR DG HAND COMPLETE 3+V*R*
2 series · 2 of 2 positions shown · non-contrast
Comparison: None.

CLINICAL DATA: Hand and wrist pain.  Injury.

RIGHT HAND - COMPLETE 3+ VIEW

[PA]
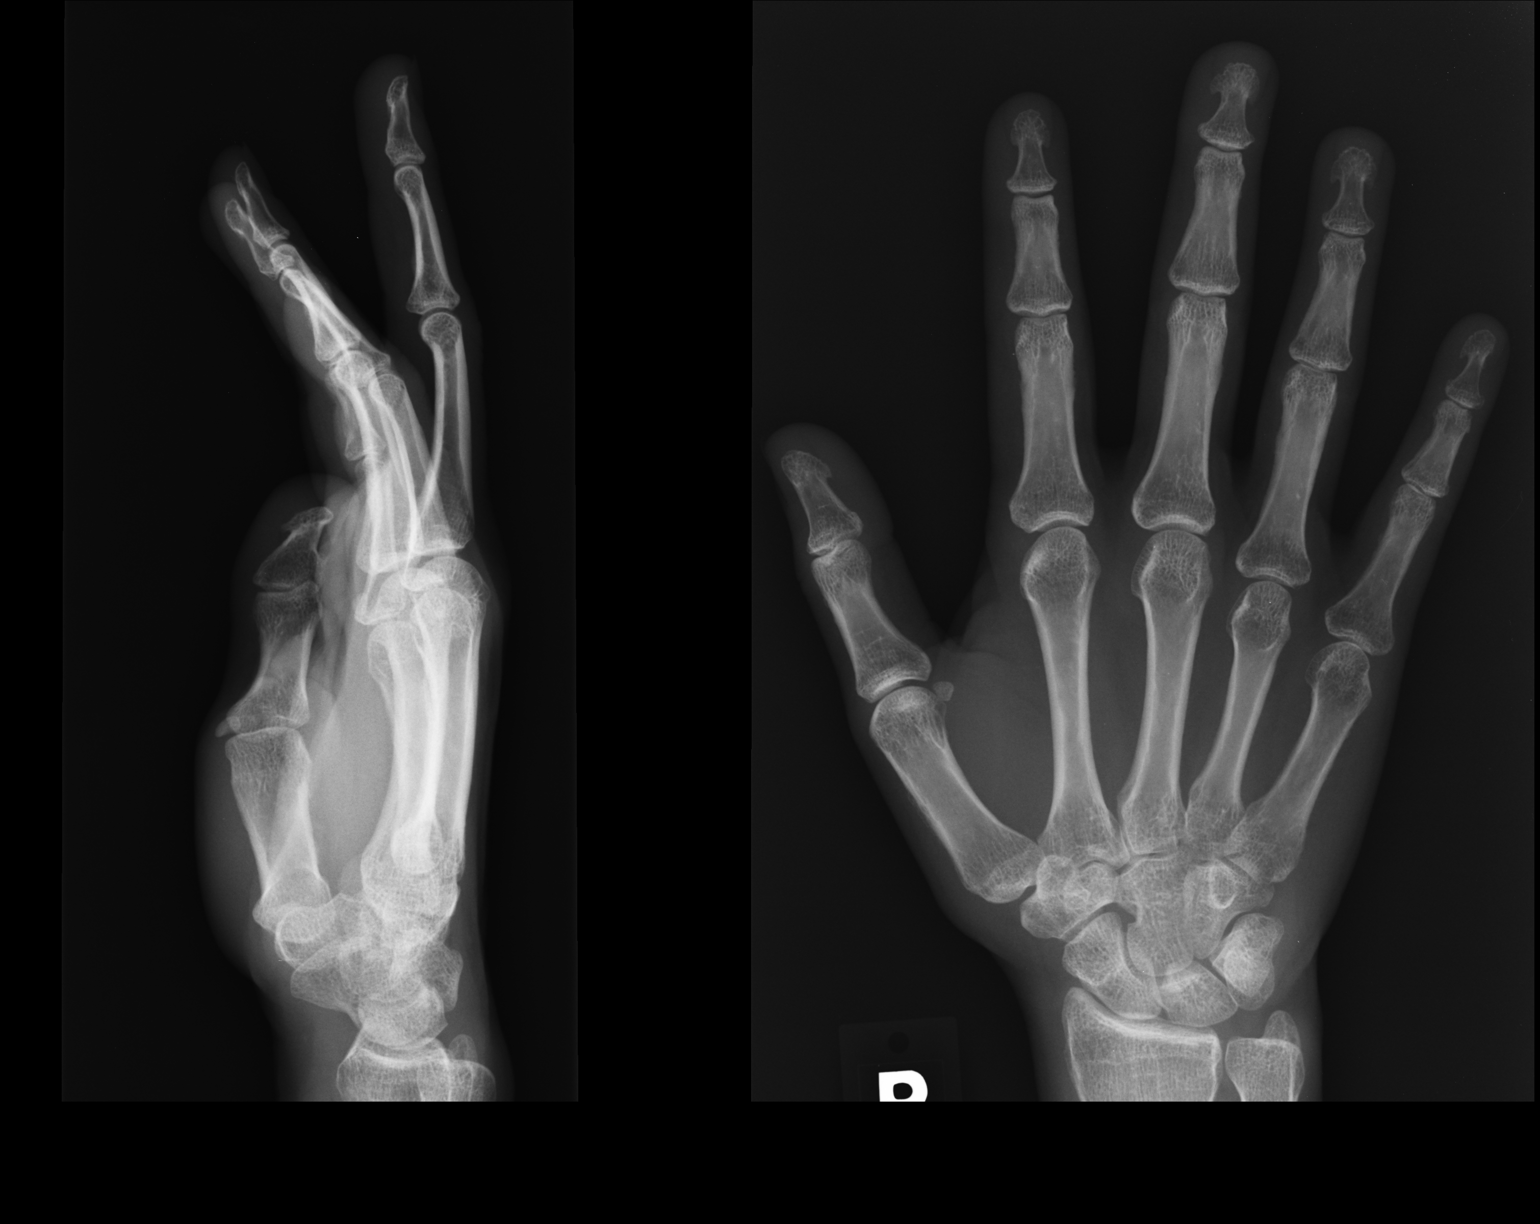

[pa obl]
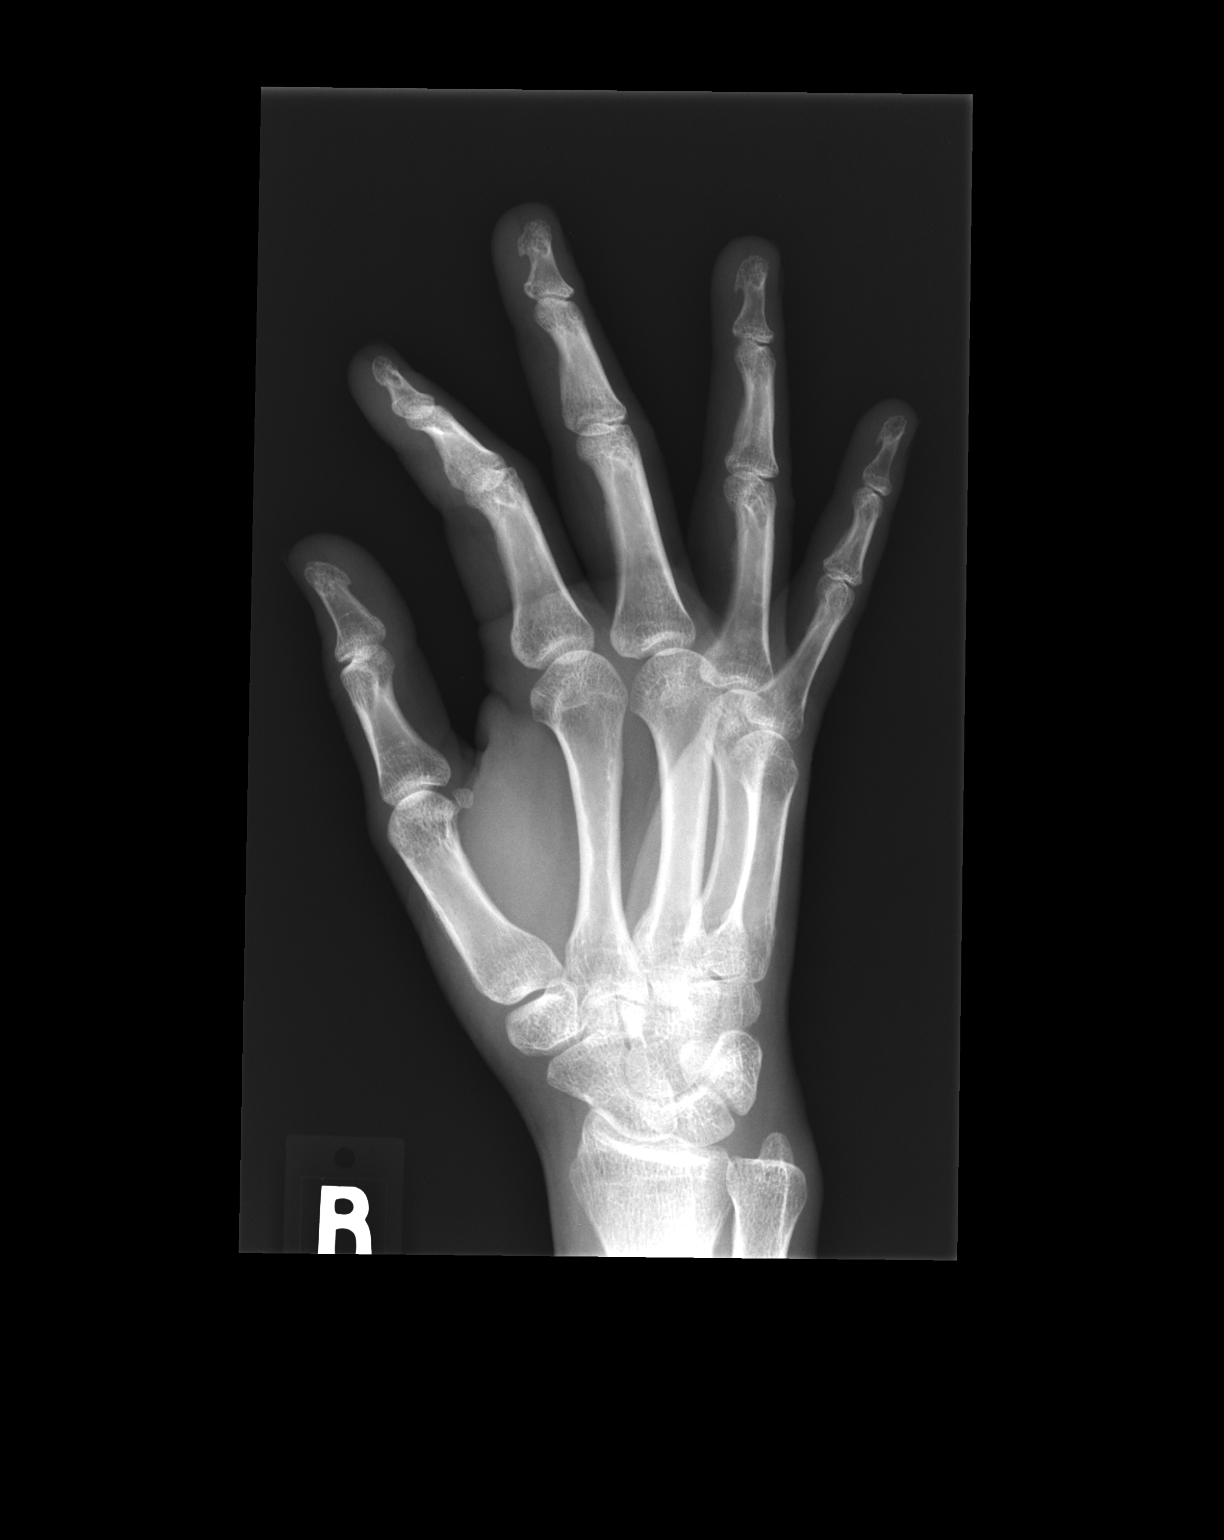

[2 of 2 positions shown; findings below may reference images not displayed]

FINDINGS: No acute bony abnormality.  Specifically, no fracture,
subluxation, or dislocation.  Soft tissues are intact. Joint spaces
are maintained.  Normal bone mineralization.
IMPRESSION: No acute bony abnormality.

## 2014-01-28 ENCOUNTER — Other Ambulatory Visit (INDEPENDENT_AMBULATORY_CARE_PROVIDER_SITE_OTHER): Payer: Managed Care, Other (non HMO)

## 2014-01-28 DIAGNOSIS — R7309 Other abnormal glucose: Secondary | ICD-10-CM

## 2014-01-28 DIAGNOSIS — E061 Subacute thyroiditis: Secondary | ICD-10-CM

## 2014-01-28 LAB — T3, FREE: T3, Free: 2.5 pg/mL (ref 2.3–4.2)

## 2014-01-28 LAB — TSH: TSH: 2.38 u[IU]/mL (ref 0.35–4.50)

## 2014-01-28 LAB — T4, FREE: Free T4: 0.72 ng/dL (ref 0.60–1.60)

## 2014-01-28 LAB — HEMOGLOBIN A1C: HEMOGLOBIN A1C: 5.6 % (ref 4.6–6.5)

## 2014-11-29 IMAGING — MG MM SCREEN MAMMOGRAM BILATERAL
5 series · 5 of 5 positions shown · non-contrast
Comparison: Previous exam(s).

CLINICAL DATA: Screening.

EXAM:
DIGITAL SCREENING BILATERAL MAMMOGRAM WITH CAD

[R CC]
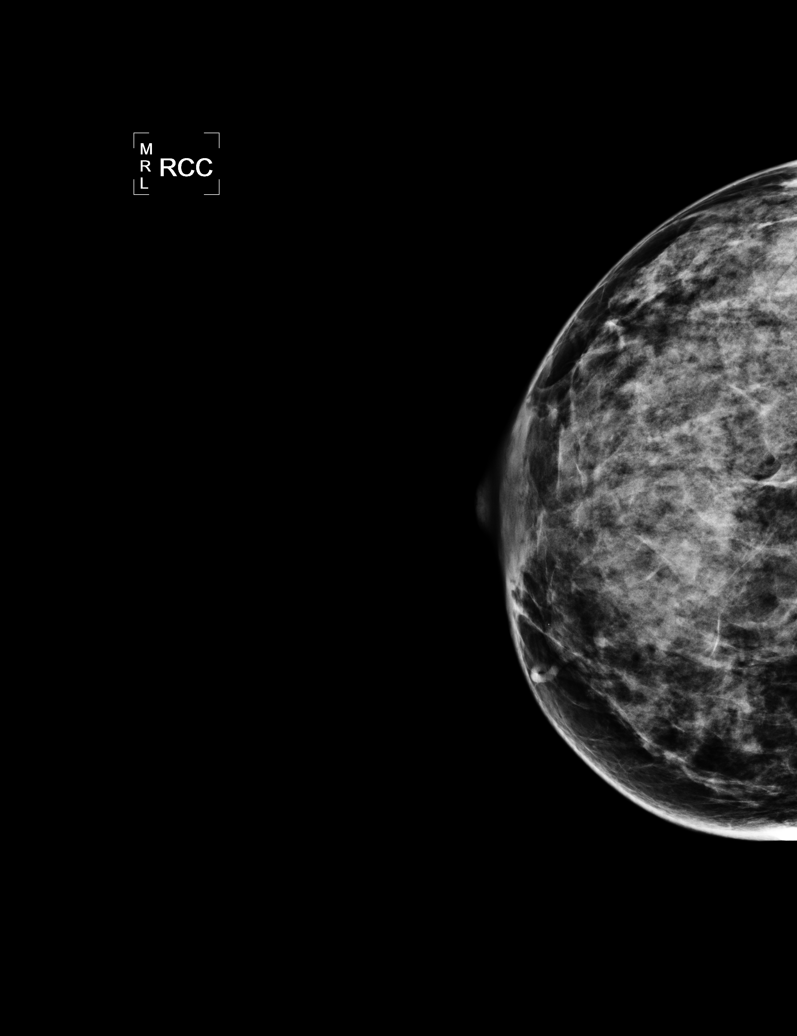

[L CC]
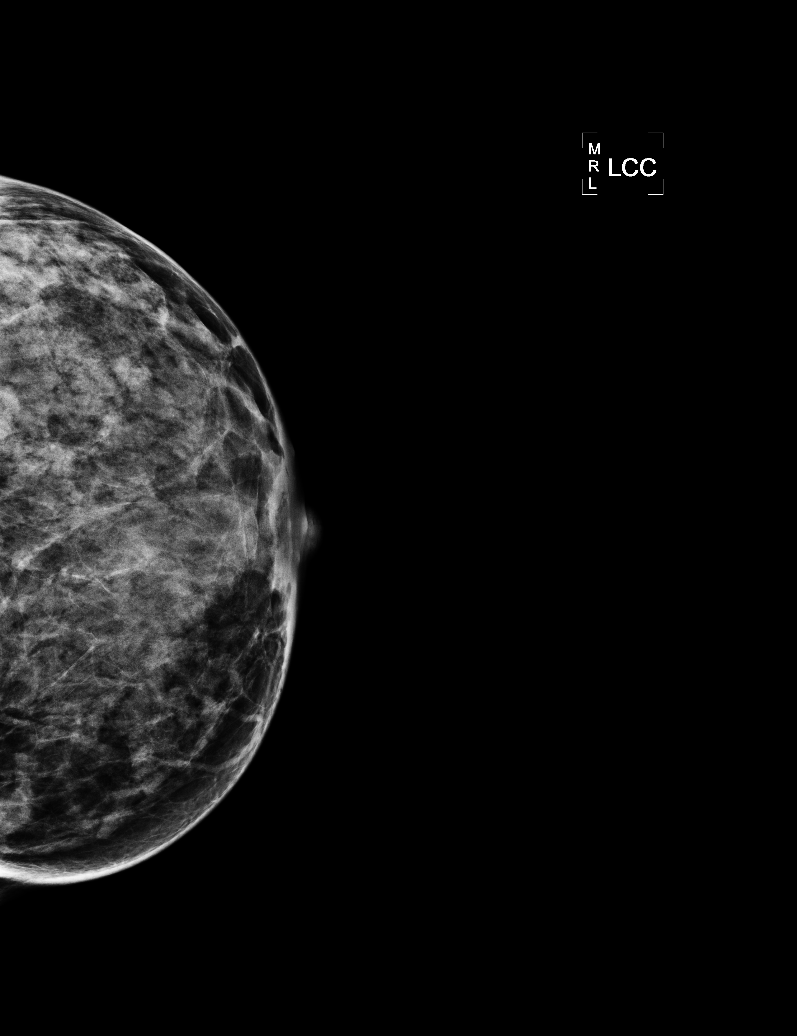

[L MLO (1 of 2)]
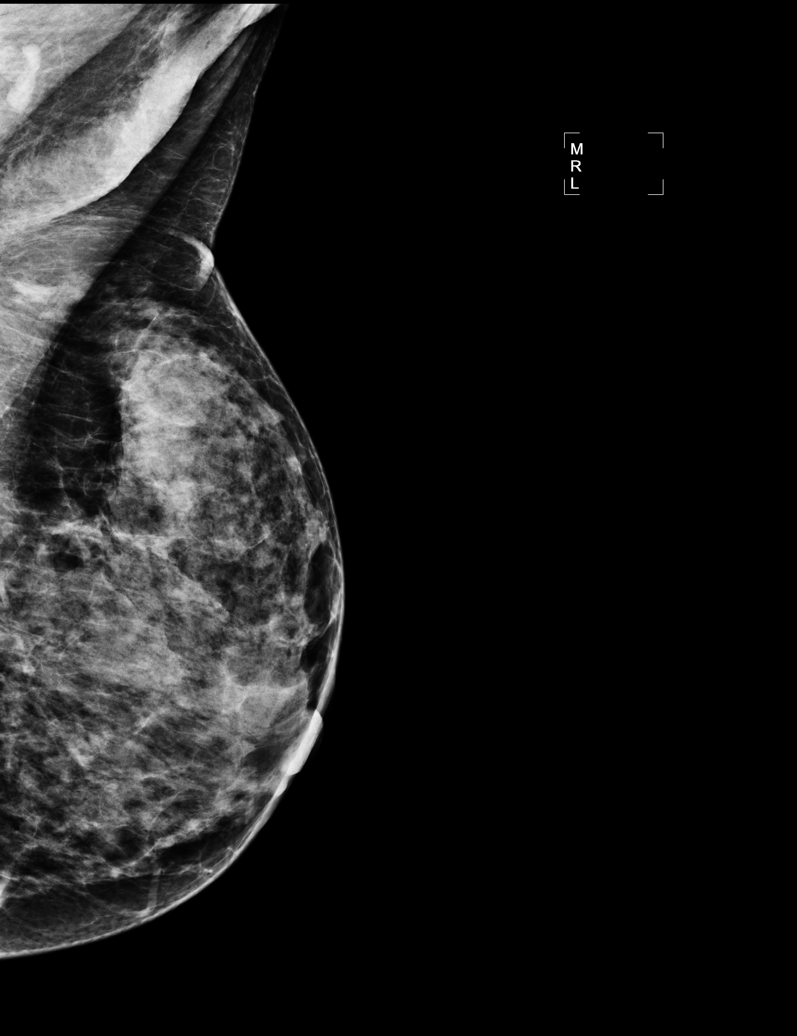

[R MLO]
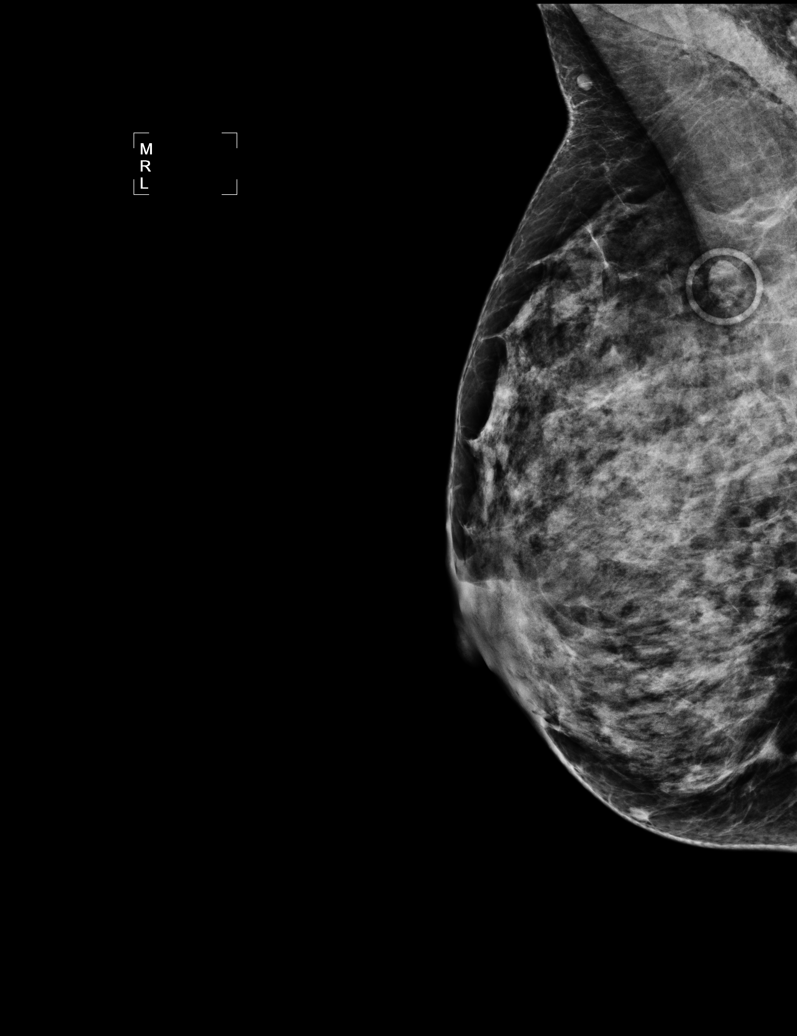

[L MLO (2 of 2)]
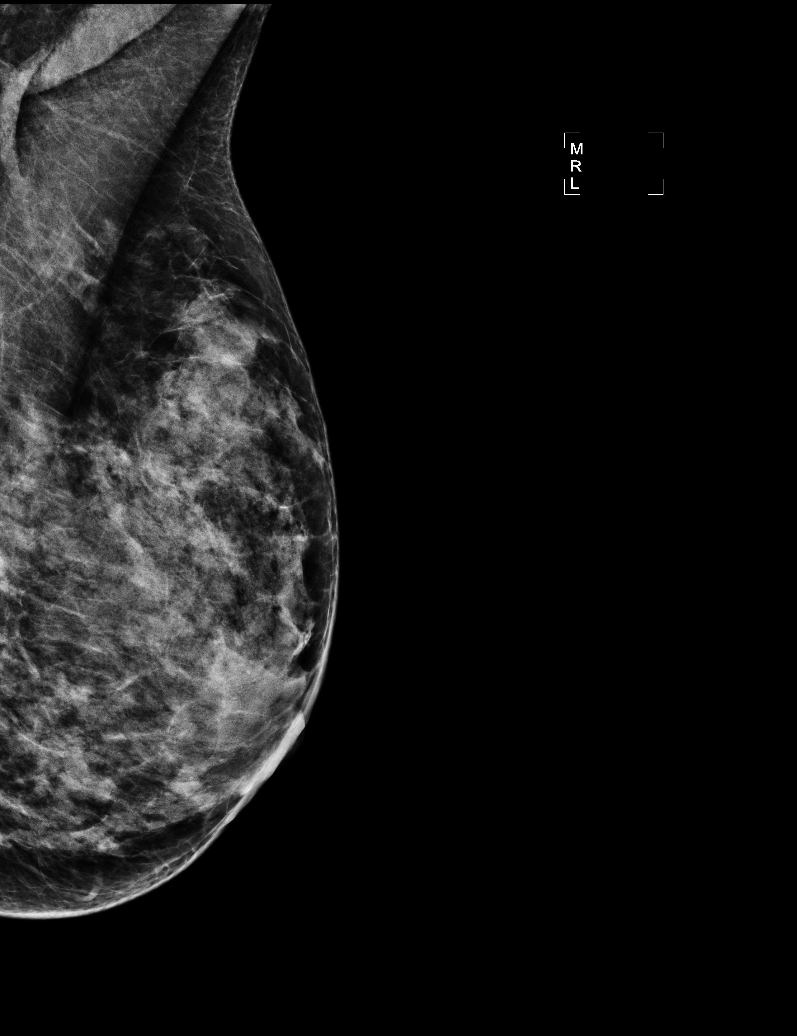

[5 of 5 positions shown; findings below may reference images not displayed]

ACR Breast Density Category d: The breasts are extremely dense,
which lowers the sensitivity of mammography.
FINDINGS: There are no findings suspicious for malignancy. Images were
processed with CAD.
IMPRESSION: No mammographic evidence of malignancy. A result letter of this
screening mammogram will be mailed directly to the patient.

RECOMMENDATION:
Screening mammogram in one year. (Code:[id number])

BI-RADS CATEGORY  1: Negative

## 2015-10-11 ENCOUNTER — Telehealth: Payer: Self-pay | Admitting: General Practice

## 2015-10-11 NOTE — Telephone Encounter (Signed)
Patient was informed that she needed an appt and she said she would call back to make an appt because she's getting ready to go out of town.

## 2015-10-11 NOTE — Telephone Encounter (Signed)
Patient would like to know if Dr. Elvera Lennox would put in an order for labs to see if the patient needs to come back to see the doctor or not.

## 2015-10-11 NOTE — Telephone Encounter (Signed)
She needs an appt.

## 2015-10-11 NOTE — Telephone Encounter (Signed)
Please send to someone in your office to schedule appointment.
